# Patient Record
Sex: Male | Born: 1995 | Race: White | Hispanic: No | Marital: Single | Smoking: Never smoker
Health system: Southern US, Community
[De-identification: ages and names within clinical notes are randomized; demographics above are authoritative.]

---
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
## 2015-04-18 ENCOUNTER — Encounter (HOSPITAL_COMMUNITY): Payer: Self-pay | Admitting: *Deleted

## 2015-04-18 ENCOUNTER — Emergency Department (HOSPITAL_COMMUNITY)
Admission: EM | Admit: 2015-04-18 | Discharge: 2015-04-18 | Disposition: A | Discharge status: Home / Self Care | Payer: Medicaid - Out of State | Attending: Emergency Medicine | Admitting: Emergency Medicine

## 2015-04-18 DIAGNOSIS — R05 Cough: Secondary | ICD-10-CM | POA: Diagnosis present

## 2015-04-18 DIAGNOSIS — J069 Acute upper respiratory infection, unspecified: Secondary | ICD-10-CM | POA: Diagnosis not present

## 2015-04-18 MED ORDER — GUAIFENESIN-CODEINE 100-10 MG/5ML PO SOLN: 5.0000 mL | Freq: Four times a day (QID) | ORAL | Status: AC | PRN

## 2015-04-18 MED ORDER — AZITHROMYCIN 250 MG PO TABS: ORAL_TABLET | ORAL | Status: AC

## 2015-04-18 MED ORDER — PREDNISONE 20 MG PO TABS: 40.0000 mg | ORAL_TABLET | Freq: Every day | ORAL | Status: AC

## 2015-04-18 NOTE — ED Notes (Signed)
Declined W/C at D/C and was escorted to lobby by RN. 

## 2015-04-18 NOTE — Discharge Instructions (Signed)
Upper Respiratory Infection, Adult Most upper respiratory infections (URIs) are a viral infection of the air passages leading to the lungs. A URI affects the nose, throat, and upper air passages. The most common type of URI is nasopharyngitis and is typically referred to as "the common cold." URIs run their course and usually go away on their own. Most of the time, a URI does not require medical attention, but sometimes a bacterial infection in the upper airways can follow a viral infection. This is called a secondary infection. Sinus and middle ear infections are common types of secondary upper respiratory infections. Bacterial pneumonia can also complicate a URI. A URI can worsen asthma and chronic obstructive pulmonary disease (COPD). Sometimes, these complications can require emergency medical care and may be life threatening.  CAUSES Almost all URIs are caused by viruses. A virus is a type of germ and can spread from one person to another.  RISKS FACTORS You may be at risk for a URI if:   You smoke.   You have chronic heart or lung disease.  You have a weakened defense (immune) system.   You are very young or very old.   You have nasal allergies or asthma.  You work in crowded or poorly ventilated areas.  You work in health care facilities or schools. SIGNS AND SYMPTOMS  Symptoms typically develop 2-3 days after you come in contact with a cold virus. Most viral URIs last 7-10 days. However, viral URIs from the influenza virus (flu virus) can last 14-18 days and are typically more severe. Symptoms may include:   Runny or stuffy (congested) nose.   Sneezing.   Cough.   Sore throat.   Headache.   Fatigue.   Fever.   Loss of appetite.   Pain in your forehead, behind your eyes, and over your cheekbones (sinus pain).  Muscle aches.  DIAGNOSIS  Your health care provider may diagnose a URI by:  Physical exam.  Tests to check that your symptoms are not due to  another condition such as:  Strep throat.  Sinusitis.  Pneumonia.  Asthma. TREATMENT  A URI goes away on its own with time. It cannot be cured with medicines, but medicines may be prescribed or recommended to relieve symptoms. Medicines may help:  Reduce your fever.  Reduce your cough.  Relieve nasal congestion. HOME CARE INSTRUCTIONS   Take medicines only as directed by your health care provider.   Gargle warm saltwater or take cough drops to comfort your throat as directed by your health care provider.  Use a warm mist humidifier or inhale steam from a shower to increase air moisture. This may make it easier to breathe.  Drink enough fluid to keep your urine clear or pale yellow.   Eat soups and other clear broths and maintain good nutrition.   Rest as needed.   Return to work when your temperature has returned to normal or as your health care provider advises. You may need to stay home longer to avoid infecting others. You can also use a face mask and careful hand washing to prevent spread of the virus.  Increase the usage of your inhaler if you have asthma.   Do not use any tobacco products, including cigarettes, chewing tobacco, or electronic cigarettes. If you need help quitting, ask your health care provider. PREVENTION  The best way to protect yourself from getting a cold is to practice good hygiene.   Avoid oral or hand contact with people with cold   symptoms.   Wash your hands often if contact occurs.  There is no clear evidence that vitamin C, vitamin E, echinacea, or exercise reduces the chance of developing a cold. However, it is always recommended to get plenty of rest, exercise, and practice good nutrition.  SEEK MEDICAL CARE IF:   You are getting worse rather than better.   Your symptoms are not controlled by medicine.   You have chills.  You have worsening shortness of breath.  You have brown or red mucus.  You have yellow or brown nasal  discharge.  You have pain in your face, especially when you bend forward.  You have a fever.  You have swollen neck glands.  You have pain while swallowing.  You have white areas in the back of your throat. SEEK IMMEDIATE MEDICAL CARE IF:   You have severe or persistent:  Headache.  Ear pain.  Sinus pain.  Chest pain.  You have chronic lung disease and any of the following:  Wheezing.  Prolonged cough.  Coughing up blood.  A change in your usual mucus.  You have a stiff neck.  You have changes in your:  Vision.  Hearing.  Thinking.  Mood. MAKE SURE YOU:   Understand these instructions.  Will watch your condition.  Will get help right away if you are not doing well or get worse.   This information is not intended to replace advice given to you by your health care provider. Make sure you discuss any questions you have with your health care provider.   Document Released: 10/15/2000 Document Revised: 09/05/2014 Document Reviewed: 07/27/2013 Elsevier Interactive Patient Education 2016 [employer]  

## 2015-04-18 NOTE — ED Provider Notes (Signed)
CSN: [id number]     Arrival date & time 04/18/15  1111 History  By signing my name below, I, Essence Howell, attest that this documentation has been prepared under the direction and in the presence of Arthor CaptainAbigail Damani Kelemen, PA-C Electronically Signed: Charline BillsEssence Howell, ED Scribe 04/18/2015 at 12:19 PM.   Chief Complaint  Patient presents with  . Nasal Congestion  . Cough   The history is provided by the patient. No language interpreter was used.   HPI Comments: David Fowler is a 19 y.o. male who presents to the Emergency Department complaining of persistent cough for the past 3 weeks. Pt reports associated 7/10 sore throat that is worse at night, nasal congestion, sinus pressure, fever 2 weeks ago that resolved. He states that symptoms improved but recently returned. No medications tried PTA. Pt denies ear pain and HA. No h/o asthma. No medical allergies   No past medical history on file. No past surgical history on file. No family history on file. Social History  Substance Use Topics  . Smoking status: Never Smoker   . Smokeless tobacco: Not on file  . Alcohol Use: No    Review of Systems  Constitutional: Positive for fever (resolved).  HENT: Positive for congestion, sinus pressure and sore throat. Negative for ear pain.   Respiratory: Positive for cough.   Neurological: Negative for numbness.   Allergies  Review of patient's allergies indicates no known allergies.  Home Medications   Prior to Admission medications   Medication Sig Start Date End Date Taking? Authorizing Provider  guaiFENesin (MUCINEX) 600 MG 12 hr tablet Take 1,200 mg by mouth 2 (two) times daily as needed for congestion (and cough).    Historical Provider, MD  HYDROcodone-homatropine (HYCODAN) 5-1.5 MG/5ML syrup Take 2.5 mLs by mouth every 6 (six) hours as needed for cough. 02/20/13   Arthor CaptainAbigail Raedyn Klinck, PA-C  ibuprofen (ADVIL,MOTRIN) 200 MG tablet Take 600 mg by mouth every 6 (six) hours as needed for pain.     Historical Provider, MD   BP 116/80 mmHg  Pulse 74  Temp(Src) 98.1 F (36.7 C) (Oral)  Resp 16  Ht 6' (1.829 m)  Wt 170 lb 3.2 oz (77.202 kg)  BMI 23.08 kg/m2  SpO2 100% Physical Exam  Constitutional: He is oriented to person, place, and time. He appears well-developed and well-nourished. No distress.  HENT:  Head: Normocephalic and atraumatic.  Right Ear: Tympanic membrane normal.  Left Ear: Tympanic membrane normal.  Mouth/Throat: Posterior oropharyngeal erythema present. No oropharyngeal exudate.  Eyes: Conjunctivae and EOM are normal.  Neck: Neck supple. No tracheal deviation present.  Cardiovascular: Normal rate.   Pulmonary/Chest: Effort normal. No respiratory distress.  Musculoskeletal: Normal range of motion.  Neurological: He is alert and oriented to person, place, and time.  Skin: Skin is warm and dry.  Psychiatric: He has a normal mood and affect. His behavior is normal.  Nursing note and vitals reviewed.  ED Course  Procedures (including critical care time) DIAGNOSTIC STUDIES: Oxygen Saturation is 10% on RA, normal by my interpretation.    COORDINATION OF CARE: 12:23 PM-Discussed treatment plan which includes Prednisone and Zirthomax with pt at bedside and pt agreed to plan.   Labs Review Labs Reviewed - No data to display  Imaging Review No results found. I have personally reviewed and evaluated these images and lab results as part of my medical decision-making.   EKG Interpretation None      MDM  Patients symptoms are consistent with URI, likely viral  etiology. Pt will be discharged with anitbiotics. Verbalizes understanding and is agreeable with plan. Pt is hemodynamically stable & in NAD prior to dc.  Final diagnoses:  URI (upper respiratory infection)    I personally performed the services described in this documentation, which was scribed in my presence. The recorded information has been reviewed and is accurate.       Arthor Captain,  PA-C 04/18/15 1231  Tilden Fossa, MD 04/19/15 [phone]

## 2015-04-18 NOTE — ED Notes (Signed)
PT reports a cough for 3 weeks and nasal congestion.

## 2015-04-18 NOTE — ED Notes (Signed)
SEE PA assessment 

## 2015-08-02 ENCOUNTER — Emergency Department (HOSPITAL_COMMUNITY)
Admission: EM | Admit: 2015-08-02 | Discharge: 2015-08-02 | Disposition: A | Discharge status: Home / Self Care | Payer: Medicaid - Out of State | Attending: Emergency Medicine | Admitting: Emergency Medicine

## 2015-08-02 ENCOUNTER — Encounter (HOSPITAL_COMMUNITY): Payer: Self-pay

## 2015-08-02 DIAGNOSIS — K6289 Other specified diseases of anus and rectum: Secondary | ICD-10-CM | POA: Diagnosis present

## 2015-08-02 DIAGNOSIS — Z792 Long term (current) use of antibiotics: Secondary | ICD-10-CM | POA: Diagnosis not present

## 2015-08-02 DIAGNOSIS — K649 Unspecified hemorrhoids: Secondary | ICD-10-CM

## 2015-08-02 DIAGNOSIS — Z7952 Long term (current) use of systemic steroids: Secondary | ICD-10-CM | POA: Insufficient documentation

## 2015-08-02 MED ORDER — POLYETHYLENE GLYCOL 3350 17 GM/SCOOP PO POWD: 17.0000 g | Freq: Two times a day (BID) | ORAL | Status: AC

## 2015-08-02 NOTE — ED Notes (Signed)
Patient here with 1 week of rectal pain with bowel movement, small amount of blood with BM per patient

## 2015-08-02 NOTE — ED Notes (Signed)
Declined W/C at D/C and was escorted to lobby by RN. 

## 2015-08-02 NOTE — Discharge Instructions (Signed)

## 2015-08-02 NOTE — ED Provider Notes (Signed)
History  By signing my name below, I, Karle Plumber, attest that this documentation has been prepared under the direction and in the presence of Rob [place], [place]. Electronically Signed: Karle Plumber, ED Scribe. 08/02/2015. 12:15 PM.  Chief Complaint  Patient presents with  . rectal pain/hemmorrhoid    The history is provided by the patient and medical records. No language interpreter was used.    HPI Comments:  David Fowler is a 20 y.o. male who presents to the Emergency Department complaining of rectal pain after wiping for the past week. He reports there may be a "bump" in the area as well. He states the pain is improving somewhat since onset. He has not done anything to treat the symptoms. He denies modifying factors. He denies fever, chills, hematochezia, rectal bleeding, nausea or vomiting.   History reviewed. No pertinent past medical history. History reviewed. No pertinent past surgical history. No family history on file. Social History  Substance Use Topics  . Smoking status: Never Smoker   . Smokeless tobacco: None  . Alcohol Use: No    Review of Systems  Constitutional: Negative for fever and chills.  Gastrointestinal: Positive for rectal pain. Negative for nausea, vomiting, blood in stool and anal bleeding.    Allergies  Review of patient's allergies indicates no known allergies.  Home Medications   Prior to Admission medications   Medication Sig Start Date End Date Taking? Authorizing Provider  azithromycin (ZITHROMAX Z-PAK) 250 MG tablet 2 po day one, then 1 daily x 4 days 04/18/15   Arthor Captain, PA-C  guaiFENesin (MUCINEX) 600 MG 12 hr tablet Take 1,200 mg by mouth 2 (two) times daily as needed for congestion (and cough).    Historical Provider, MD  guaiFENesin-codeine 100-10 MG/5ML syrup Take 5-10 mLs by mouth every 6 (six) hours as needed for cough. 04/18/15   Arthor Captain, PA-C  HYDROcodone-homatropine (HYCODAN) 5-1.5 MG/5ML syrup Take 2.5  mLs by mouth every 6 (six) hours as needed for cough. 02/20/13   Arthor Captain, PA-C  ibuprofen (ADVIL,MOTRIN) 200 MG tablet Take 600 mg by mouth every 6 (six) hours as needed for pain.    Historical Provider, MD  predniSONE (DELTASONE) 20 MG tablet Take 2 tablets (40 mg total) by mouth daily. 04/18/15   Arthor Captain, PA-C   Triage Vitals: BP 118/77 mmHg  Pulse 88  Temp(Src) 98.2 F (36.8 C) (Oral)  Resp 18  Ht  (1.854 m)  Wt 170 lb (77.111 kg)  BMI 22.43 kg/m2  SpO2 100% Physical Exam Physical Exam  Constitutional: Pt appears well-developed and well-nourished. No distress.  Awake, alert, nontoxic appearance  HENT:  Head: Normocephalic and atraumatic.  Mouth/Throat: Oropharynx is clear and moist. No oropharyngeal exudate.  Eyes: Conjunctivae are normal. No scleral icterus.  Neck: Normal range of motion. Neck supple.  Cardiovascular: Normal rate, regular rhythm and intact distal pulses.   Pulmonary/Chest: Effort normal and breath sounds normal. No respiratory distress. Pt has no wheezes.  Equal chest expansion  Abdominal: Soft. Bowel sounds are normal. Pt exhibits no mass. There is no tenderness. There is no rebound and no guarding.  Musculoskeletal: Normal range of motion. Pt exhibits no edema.  GU: Exam chaperoned by scribe. Mild external hemorrhoid without thrombosis. No bleeding or abscess.  Neurological: Pt is alert.  Speech is clear and goal oriented Moves extremities without ataxia  Skin: Skin is warm and dry. Pt is not diaphoretic.  Psychiatric: Pt has a normal mood and affect.  Nursing note and  vitals reviewed.   ED Course  Procedures (including critical care time) DIAGNOSTIC STUDIES: Oxygen Saturation is 100% on RA, normal by my interpretation.   COORDINATION OF CARE: 12:15 PM- Will prescribe stool softener. Encouraged pt to take warm baths if pain persists. Pt verbalizes understanding and agrees to plan.  Medications - No data to display   MDM    Final diagnoses:  Hemorrhoids, unspecified hemorrhoid type    Small, non-thrombosed external hemorrhoid.  Conservative therapy. No bleeding.  VSS.  I personally performed the services described in this documentation, which was scribed in my presence. The recorded information has been reviewed and is accurate.       Roxy Horsemanobert Khalen Styer, PA-C 08/02/15 1309  Mancel BaleElliott Wentz, MD 08/02/15 [phone]

## 2018-02-27 ENCOUNTER — Encounter (HOSPITAL_COMMUNITY): Payer: Self-pay

## 2018-02-27 ENCOUNTER — Other Ambulatory Visit: Payer: Self-pay

## 2018-02-27 ENCOUNTER — Emergency Department (HOSPITAL_COMMUNITY)
Admission: EM | Admit: 2018-02-27 | Discharge: 2018-02-27 | Disposition: A | Discharge status: Home / Self Care | Payer: 59 | Attending: Emergency Medicine | Admitting: Emergency Medicine

## 2018-02-27 ENCOUNTER — Emergency Department (HOSPITAL_COMMUNITY): Payer: 59

## 2018-02-27 DIAGNOSIS — W108XXA Fall (on) (from) other stairs and steps, initial encounter: Secondary | ICD-10-CM | POA: Insufficient documentation

## 2018-02-27 DIAGNOSIS — Z23 Encounter for immunization: Secondary | ICD-10-CM | POA: Diagnosis not present

## 2018-02-27 DIAGNOSIS — F10929 Alcohol use, unspecified with intoxication, unspecified: Secondary | ICD-10-CM | POA: Insufficient documentation

## 2018-02-27 DIAGNOSIS — F10920 Alcohol use, unspecified with intoxication, uncomplicated: Secondary | ICD-10-CM

## 2018-02-27 DIAGNOSIS — S01112A Laceration without foreign body of left eyelid and periocular area, initial encounter: Secondary | ICD-10-CM | POA: Insufficient documentation

## 2018-02-27 DIAGNOSIS — Y9389 Activity, other specified: Secondary | ICD-10-CM | POA: Diagnosis not present

## 2018-02-27 DIAGNOSIS — Y929 Unspecified place or not applicable: Secondary | ICD-10-CM | POA: Diagnosis not present

## 2018-02-27 DIAGNOSIS — Z7982 Long term (current) use of aspirin: Secondary | ICD-10-CM | POA: Insufficient documentation

## 2018-02-27 DIAGNOSIS — Y999 Unspecified external cause status: Secondary | ICD-10-CM | POA: Diagnosis not present

## 2018-02-27 DIAGNOSIS — W19XXXA Unspecified fall, initial encounter: Secondary | ICD-10-CM

## 2018-02-27 DIAGNOSIS — S0181XA Laceration without foreign body of other part of head, initial encounter: Secondary | ICD-10-CM

## 2018-02-27 MED ORDER — TETANUS-DIPHTH-ACELL PERTUSSIS 5-2.5-18.5 LF-MCG/0.5 IM SUSP
0.5000 mL | Freq: Once | INTRAMUSCULAR | Status: AC
  Administered 2018-02-27: 0.5 mL via INTRAMUSCULAR
  Filled 2018-02-27: qty 0.5

## 2018-02-27 MED ORDER — LIDOCAINE-EPINEPHRINE-TETRACAINE (LET) SOLUTION
3.0000 mL | Freq: Once | NASAL | Status: AC
  Administered 2018-02-27: 3 mL via TOPICAL
  Filled 2018-02-27: qty 3

## 2018-02-27 MED ORDER — LIDOCAINE-EPINEPHRINE (PF) 2 %-1:200000 IJ SOLN
10.0000 mL | Freq: Once | INTRAMUSCULAR | Status: AC
  Administered 2018-02-27: 10 mL
  Filled 2018-02-27: qty 20

## 2018-02-27 NOTE — ED Notes (Signed)
Ambulated length of unit gait steady denies pain alert x3 denies nausea after 1 cup of fluid

## 2018-02-27 NOTE — Discharge Instructions (Signed)

## 2018-02-27 NOTE — ED Triage Notes (Addendum)
Pt reports that he is intoxicated and fell down some steps. He is unsure how many, thinks about 5. He presents with a laceration above his L eyebrow after hitting his head. Bleeding controlled. Denies LOC.

## 2018-02-27 NOTE — ED Provider Notes (Signed)
[facility]-EMERGENCY DEPT Provider Note   CSN: [id number] Arrival date & time: 02/27/18  0218     History   Chief Complaint Chief Complaint  Patient presents with  . Laceration    HPI David Fowler is a 22 y.o. male with a hx of no major medical problems presents to the Emergency Department complaining of acute left facial laceration onset just PTA.  Pt's friend reports top triage RN that he fell down 5 stairs, but this friend is not at bedside upon my evaluation.  Pt arouses enough to tell me he drank "a lot" of tequila, but is unable to answer questions about the events tonight.    Level 5 Caveat for AMS.    HPI  History reviewed. No pertinent past medical history.  There are no active problems to display for this patient.   History reviewed. No pertinent surgical history.      Home Medications    Prior to Admission medications   Medication Sig Start Date End Date Taking? Authorizing Provider  aspirin-acetaminophen-caffeine (EXCEDRIN MIGRAINE) [phone] MG tablet Take 2 tablets by mouth every 6 (six) hours as needed for headache.   Yes [provider]  azithromycin (ZITHROMAX Z-PAK) 250 MG tablet 2 po day one, then 1 daily x 4 days Patient not taking: Reported on 02/27/2018 04/18/15   Arthor Captain, PA-C  guaiFENesin-codeine 100-10 MG/5ML syrup Take 5-10 mLs by mouth every 6 (six) hours as needed for cough. Patient not taking: Reported on 02/27/2018 04/18/15   Arthor Captain, PA-C  HYDROcodone-homatropine [facility]) 5-1.5 MG/5ML syrup Take 2.5 mLs by mouth every 6 (six) hours as needed for cough. Patient not taking: Reported on 02/27/2018 02/20/13   Arthor Captain, PA-C  polyethylene glycol powder (GLYCOLAX/MIRALAX) powder Take 17 g by mouth 2 (two) times daily. Until daily soft stools  OTC Patient not taking: Reported on 02/27/2018 08/02/15   Roxy Horseman, PA-C  predniSONE (DELTASONE) 20 MG tablet Take 2 tablets (40 mg total)  by mouth daily. Patient not taking: Reported on 02/27/2018 04/18/15   Arthor Captain, PA-C    Family History History reviewed. No pertinent family history.  Social History Social History   Tobacco Use  . Smoking status: Never Smoker  Substance Use Topics  . Alcohol use: No  . Drug use: No     Allergies   Patient has no known allergies.   Review of Systems Review of Systems  Unable to perform ROS: Mental status change     Physical Exam Updated Vital Signs BP 120/83 (BP Location: Left Arm)   Pulse 96   Temp 98.2 F (36.8 C) (Oral)   Resp 16   Ht 6\' 1"  (1.854 m)   Wt 77.1 kg   SpO2 95%   BMI 22.43 kg/m   Physical Exam  Constitutional: He is oriented to person, place, and time. He appears well-developed and well-nourished. No distress.  Pt very sleepy and difficult to arouse.  Unable to answer questions about incident tonight.   HENT:  Head: Normocephalic.    Nose: No epistaxis.  Mouth/Throat: Oropharynx is clear and moist.  No Malocclusion No battle signs No [place] eyes No hemotympanum bilaterally  Eyes: Pupils are equal, round, and reactive to light. Conjunctivae and EOM are normal. No scleral icterus.  No horizontal, vertical or rotational nystagmus  Neck: Normal range of motion. Neck supple. No spinous process tenderness and no muscular tenderness present. Normal range of motion present.  Full active and passive ROM without pain No  midline or paraspinal tenderness No nuchal rigidity or meningeal signs  Cardiovascular: Normal rate, regular rhythm and intact distal pulses.  Pulmonary/Chest: Effort normal and breath sounds normal. No respiratory distress. He has no wheezes. He has no rales.  Abdominal: Soft. Bowel sounds are normal. There is no tenderness. There is no rebound and no guarding.  Musculoskeletal: Normal range of motion.  Lymphadenopathy:    He has no cervical adenopathy.  Neurological: He is alert and oriented to person, place, and time.  No cranial nerve deficit. He exhibits normal muscle tone. Coordination normal.  Moves all extremities without ataxia Follows 1 step commands with significant arousal. Strength 5/5 in BUE and BLE  Skin: Skin is warm and dry. No rash noted. He is not diaphoretic.  Psychiatric: He has a normal mood and affect. His behavior is normal. Judgment and thought content normal.  Nursing note and vitals reviewed.    ED Treatments / Results   Radiology Ct Head Wo Contrast  Result Date: 02/27/2018 CLINICAL DATA:  Fall down steps, facial trauma. Headache and neck pain. Intoxicated. EXAM: CT HEAD WITHOUT CONTRAST CT CERVICAL SPINE WITHOUT CONTRAST TECHNIQUE: Multidetector CT imaging of the head and cervical spine was performed following the standard protocol without intravenous contrast. Multiplanar CT image reconstructions of the cervical spine were also generated. COMPARISON:  None. FINDINGS: CT HEAD FINDINGS BRAIN: No intraparenchymal hemorrhage, mass effect nor midline shift. The ventricles and sulci are normal. No acute large vascular territory infarcts. No abnormal extra-axial fluid collections. Basal cisterns are patent. VASCULAR: Unremarkable. SKULL/SOFT TISSUES: No skull fracture. Small LEFT periorbital scalp hematoma without subcutaneous gas or radiopaque foreign bodies. ORBITS/SINUSES: The included ocular globes and orbital contents are normal.Trace paranasal sinus mucosal thickening. Mastoid air cells are well aerated. OTHER: None. CT CERVICAL SPINE FINDINGS ALIGNMENT: Maintained lordosis. Vertebral bodies in alignment. SKULL BASE AND VERTEBRAE: Cervical vertebral bodies and posterior elements are intact. Intervertebral disc heights preserved. No destructive bony lesions. C1-2 articulation maintained. SOFT TISSUES AND SPINAL CANAL: Nonacute. Calcified stylohyoid ligaments. Wall DISC LEVELS: No significant osseous canal stenosis or neural foraminal narrowing. UPPER CHEST: Lung apices are clear. OTHER:  None. IMPRESSION: CT HEAD: 1. No acute intracranial process. Small LEFT periorbital scalp hematoma without postseptal extension. 2. Otherwise normal noncontrast CT HEAD. CT CERVICAL SPINE: 1. Negative non-contrast CT cervical spine. Electronically Signed   By: Awilda Metro M.D.   On: 02/27/2018 05:46   Ct Cervical Spine Wo Contrast  Result Date: 02/27/2018 CLINICAL DATA:  Fall down steps, facial trauma. Headache and neck pain. Intoxicated. EXAM: CT HEAD WITHOUT CONTRAST CT CERVICAL SPINE WITHOUT CONTRAST TECHNIQUE: Multidetector CT imaging of the head and cervical spine was performed following the standard protocol without intravenous contrast. Multiplanar CT image reconstructions of the cervical spine were also generated. COMPARISON:  None. FINDINGS: CT HEAD FINDINGS BRAIN: No intraparenchymal hemorrhage, mass effect nor midline shift. The ventricles and sulci are normal. No acute large vascular territory infarcts. No abnormal extra-axial fluid collections. Basal cisterns are patent. VASCULAR: Unremarkable. SKULL/SOFT TISSUES: No skull fracture. Small LEFT periorbital scalp hematoma without subcutaneous gas or radiopaque foreign bodies. ORBITS/SINUSES: The included ocular globes and orbital contents are normal.Trace paranasal sinus mucosal thickening. Mastoid air cells are well aerated. OTHER: None. CT CERVICAL SPINE FINDINGS ALIGNMENT: Maintained lordosis. Vertebral bodies in alignment. SKULL BASE AND VERTEBRAE: Cervical vertebral bodies and posterior elements are intact. Intervertebral disc heights preserved. No destructive bony lesions. C1-2 articulation maintained. SOFT TISSUES AND SPINAL CANAL: Nonacute. Calcified stylohyoid ligaments. Wall DISC LEVELS:  No significant osseous canal stenosis or neural foraminal narrowing. UPPER CHEST: Lung apices are clear. OTHER: None. IMPRESSION: CT HEAD: 1. No acute intracranial process. Small LEFT periorbital scalp hematoma without postseptal extension. 2.  Otherwise normal noncontrast CT HEAD. CT CERVICAL SPINE: 1. Negative non-contrast CT cervical spine. Electronically Signed   By: Awilda Metro M.D.   On: 02/27/2018 05:46    Procedures .Marland KitchenLaceration Repair Date/Time: 02/27/2018 5:53 AM Performed by: Dierdre Forth, PA-C Authorized by: Dierdre Forth, PA-C   Consent:    Consent obtained:  Verbal   Consent given by:  Patient   Risks discussed:  Infection, need for additional repair, pain, poor cosmetic result and poor wound healing   Alternatives discussed:  No treatment and delayed treatment Universal protocol:    Procedure explained and questions answered to patient or proxy's satisfaction: yes     Relevant documents present and verified: yes     Test results available and properly labeled: yes     Imaging studies available: yes     Required blood products, implants, devices, and special equipment available: yes     Site/side marked: yes     Immediately prior to procedure, a time out was called: yes     Patient identity confirmed:  Verbally with patient Anesthesia (see MAR for exact dosages):    Anesthesia method:  Local infiltration   Local anesthetic:  Lidocaine 2% WITH epi Laceration details:    Location:  Face   Face location:  L eyebrow   Length (cm):  3.2 Repair type:    Repair type:  Intermediate Pre-procedure details:    Preparation:  Patient was prepped and draped in usual sterile fashion and imaging obtained to evaluate for foreign bodies Exploration:    Hemostasis achieved with:  Epinephrine and direct pressure   Wound exploration: entire depth of wound probed and visualized   Treatment:    Area cleansed with:  Saline   Amount of cleaning:  Standard   Irrigation solution:  Sterile water   Irrigation volume:  500   Irrigation method:  Syringe Skin repair:    Repair method:  Sutures   Suture size:  5-0   Suture material:  Prolene   Suture technique:  Simple interrupted   Number of sutures:   4 Approximation:    Approximation:  Close Post-procedure details:    Dressing:  Open (no dressing)   (including critical care time)  Medications Ordered in ED Medications  lidocaine-EPINEPHrine (XYLOCAINE W/EPI) 2 %-1:200000 (PF) injection 10 mL (has no administration in time range)  lidocaine-EPINEPHrine-tetracaine (LET) solution (3 mLs Topical Given 02/27/18 0257)  Tdap (BOOSTRIX) injection 0.5 mL (0.5 mLs Intramuscular Given 02/27/18 0554)     Initial Impression / Assessment and Plan / ED Course  I have reviewed the triage vital signs and the nursing notes.  Pertinent labs & imaging results that were available during my care of the patient were reviewed by me and considered in my medical decision making (see chart for details).     Pt presents intoxicated with reports of falling down 5 steps.  Left facial laceration.  Pressure irrigation performed. Wound explored and base of wound visualized in a bloodless field without evidence of foreign body.  Laceration occurred < 8 hours prior to repair which was well tolerated. Tdap updated.  Pt has no comorbidities to effect normal wound healing.   6:00 AM Pt is much more alert.  He is able to answer questions, ambulate unassisted and has tolerated PO without  difficulty.  No emesis here in the department.  CT head/neck is without acute abnormalities including no ICH or skull fracture.  FROM without pain of the cervical spine.  I personally evaluated these images.    Pt discharged without antibiotics.  Discussed suture home care with patient and answered questions. Pt to follow-up for wound check and suture removal in 7 days; they are to return to the ED sooner for signs of infection. Pt is hemodynamically stable with no complaints prior to dc.     Final Clinical Impressions(s) / ED Diagnoses   Final diagnoses:  Facial laceration, initial encounter  Fall, initial encounter  Alcoholic intoxication without complication [facility])    ED  Discharge Orders    None       Mardene Sayer Boyd Kerbs 02/27/18 1610    Molpus, Jonny Ruiz, MD 02/27/18 [phone]

## 2020-01-26 IMAGING — CT CT CERVICAL SPINE W/O CM
4 of 7 series · 13 of 33 positions shown, 14 images · non-contrast
Comparison: None.

CLINICAL DATA: Fall down steps, facial trauma. Headache and neck
pain. Intoxicated.

EXAM:
CT HEAD WITHOUT CONTRAST
CT CERVICAL SPINE WITHOUT CONTRAST
TECHNIQUE: Multidetector CT imaging of the head and cervical spine was
performed following the standard protocol without intravenous
contrast. Multiplanar CT image reconstructions of the cervical spine
were also generated.

[Series 9: c spine soft · axial · 0.30mm/px · z∈[-254,-144]mm · 4 of 93 slices shown]
[im 19/93  soft-tissue]
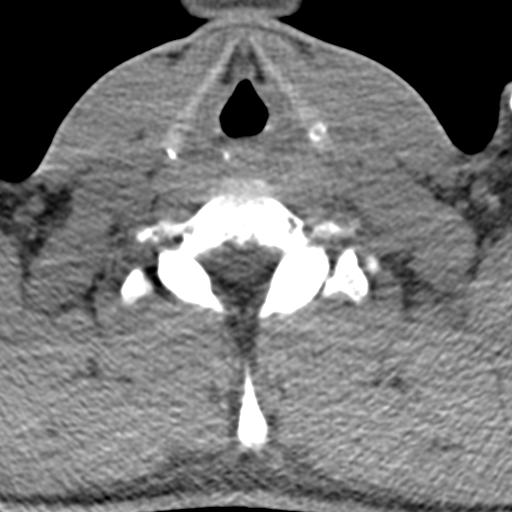
[im 37/93  soft-tissue]
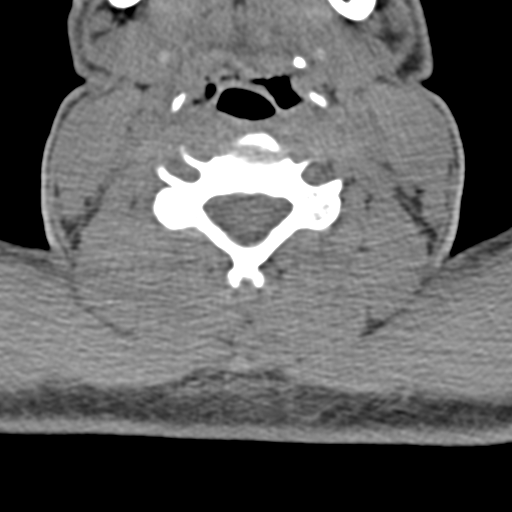
[im 56/93  soft-tissue]
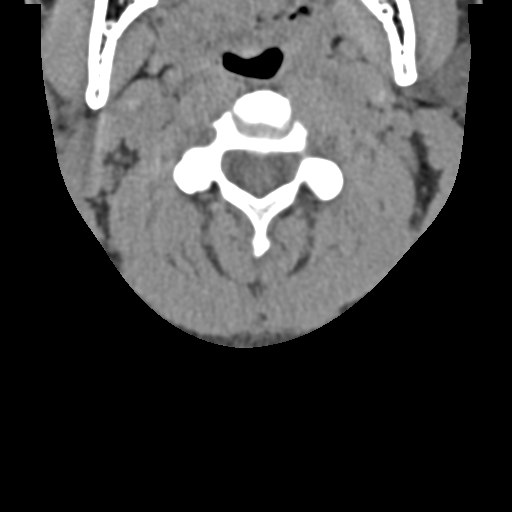
[im 74/93  soft-tissue]
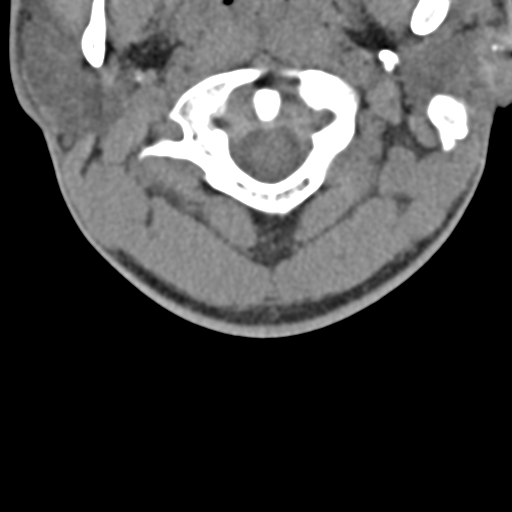

[Series 11: orthogonal bone · axial · 0.23mm/px · z∈[-290,-168]mm · 4 of 109 slices shown, 5 images]
[im 22/109  soft-tissue]
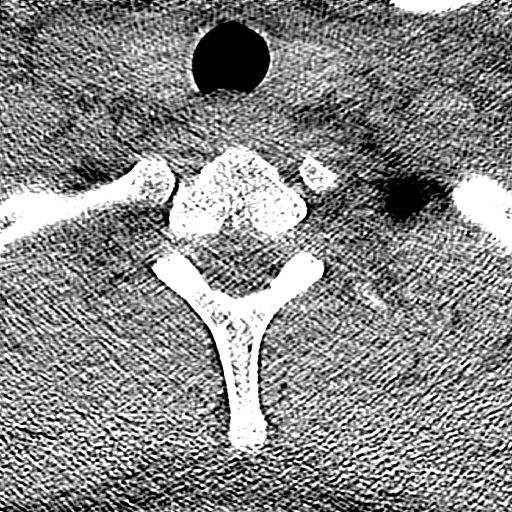
[im 22/109  bone]
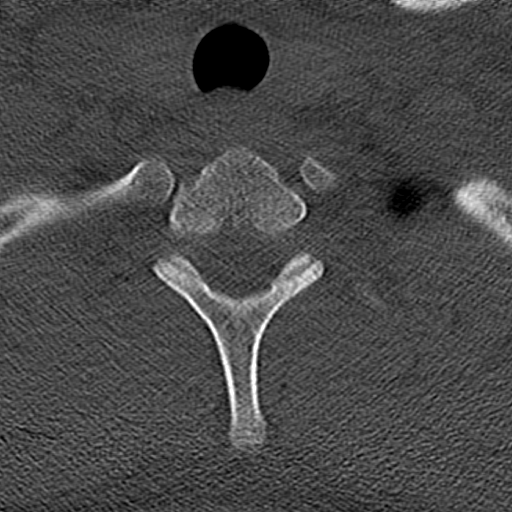
[im 44/109  bone]
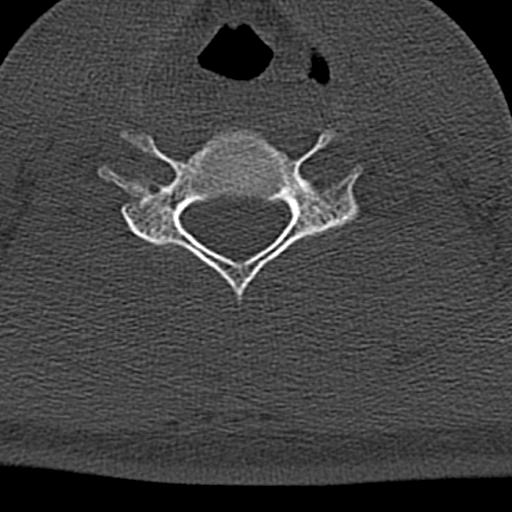
[im 65/109  bone]
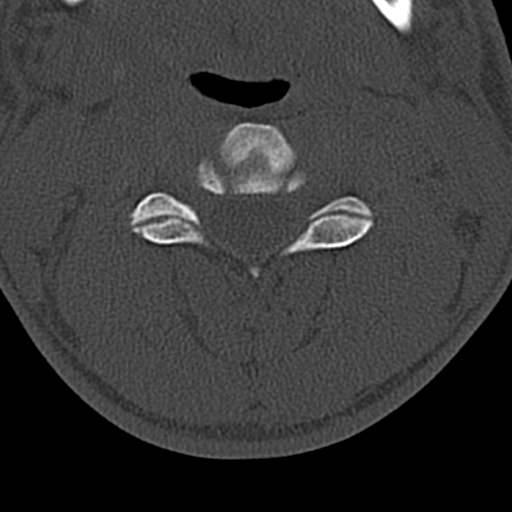
[im 87/109  bone]
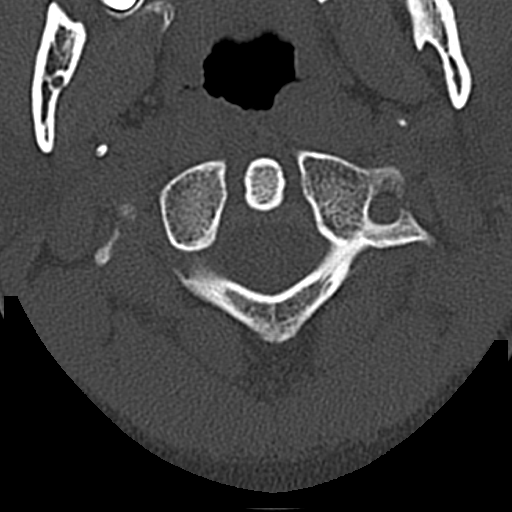

[Series 12: coronal bone · coronal · 0.27mm/px · 1 of 61 slices shown]
[im 31/61  bone]
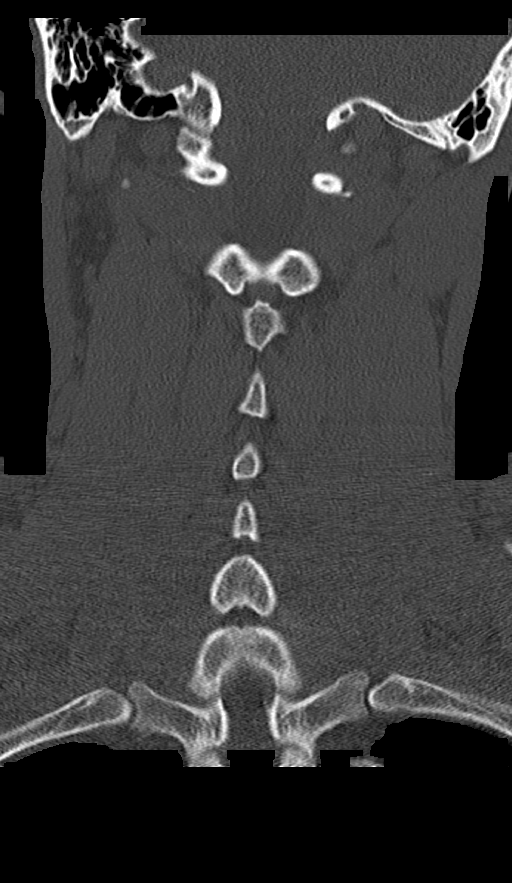

[Series 13: sagittal bone · sagittal · 0.27mm/px · 4 of 61 slices shown]
[im 13/61  bone]
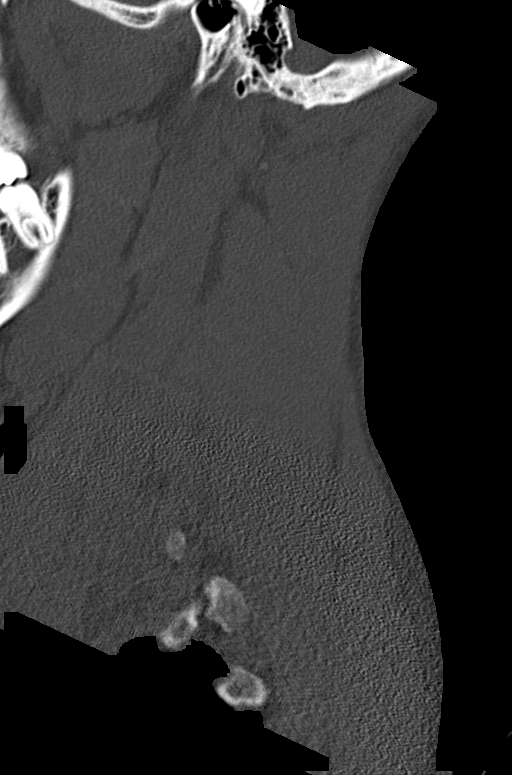
[im 25/61  bone]
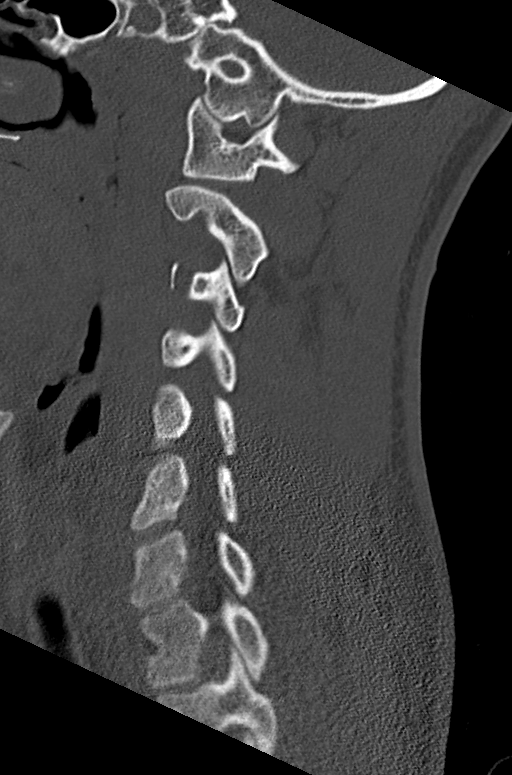
[im 37/61  bone]
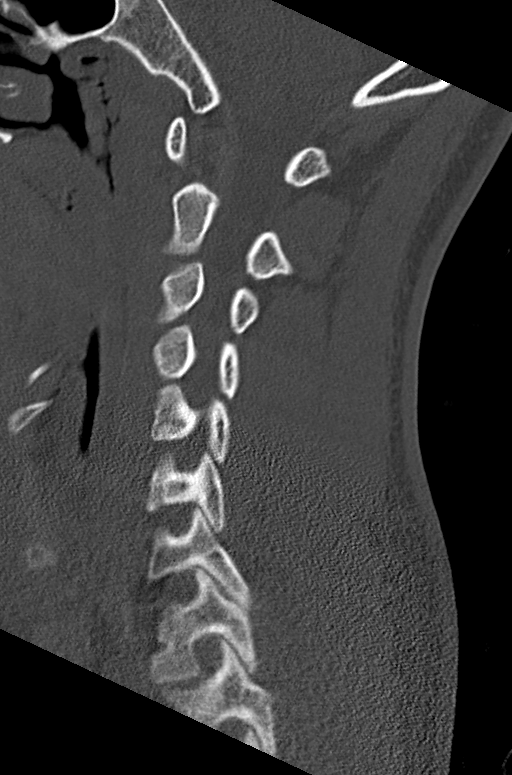
[im 49/61  bone]
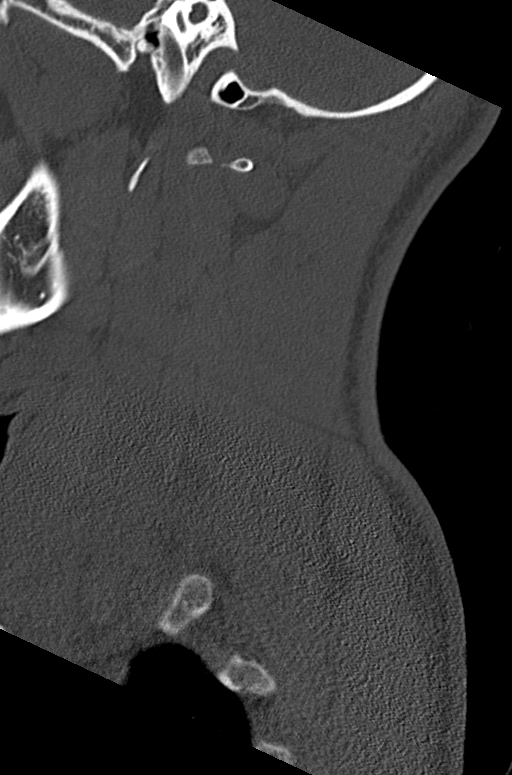

[13 of 33 positions shown; findings below may reference images not displayed]

FINDINGS: CT HEAD FINDINGS

BRAIN: No intraparenchymal hemorrhage, mass effect nor midline
shift. The ventricles and sulci are normal. No acute large vascular
territory infarcts. No abnormal extra-axial fluid collections. Basal
cisterns are patent.

VASCULAR: Unremarkable.

SKULL/SOFT TISSUES: No skull fracture. Small LEFT periorbital scalp
hematoma without subcutaneous gas or radiopaque foreign bodies.

ORBITS/SINUSES: The included ocular globes and orbital contents are
normal.Trace paranasal sinus mucosal thickening. Mastoid air cells
are well aerated.

OTHER: None.

CT CERVICAL SPINE FINDINGS

ALIGNMENT: Maintained lordosis. Vertebral bodies in alignment.

SKULL BASE AND VERTEBRAE: Cervical vertebral bodies and posterior
elements are intact. Intervertebral disc heights preserved. No
destructive bony lesions. C1-2 articulation maintained.

SOFT TISSUES AND SPINAL CANAL: Nonacute. Calcified stylohyoid
ligaments. Wall

DISC LEVELS: No significant osseous canal stenosis or neural
foraminal narrowing.

UPPER CHEST: Lung apices are clear.

OTHER: None.
IMPRESSION: CT HEAD:

1. No acute intracranial process. Small LEFT periorbital scalp
hematoma without postseptal extension.
2. Otherwise normal noncontrast CT HEAD.

CT CERVICAL SPINE:

1. Negative non-contrast CT cervical spine.
